# Patient Record
Sex: Female | Born: 1985 | Race: White | Hispanic: No | Marital: Single | Smoking: Former smoker
Health system: Southern US, Community
[De-identification: ages and names within clinical notes are randomized; demographics above are authoritative.]

## PROBLEM LIST (undated history)

## (undated) DIAGNOSIS — D509 Iron deficiency anemia, unspecified: Secondary | ICD-10-CM

## (undated) DIAGNOSIS — K250 Acute gastric ulcer with hemorrhage: Secondary | ICD-10-CM

---
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
## 2006-02-13 ENCOUNTER — Ambulatory Visit: Payer: Self-pay | Admitting: Pediatrics

## 2006-03-26 ENCOUNTER — Other Ambulatory Visit: Admission: RE | Admit: 2006-03-26 | Discharge: 2006-03-26 | Payer: Self-pay | Admitting: *Deleted

## 2006-04-01 ENCOUNTER — Ambulatory Visit: Payer: Self-pay | Admitting: Pediatrics

## 2006-08-26 ENCOUNTER — Ambulatory Visit: Payer: Self-pay | Admitting: Pediatrics

## 2007-01-23 ENCOUNTER — Ambulatory Visit: Payer: Self-pay | Admitting: Pediatrics

## 2007-01-28 ENCOUNTER — Ambulatory Visit (HOSPITAL_COMMUNITY): Admission: RE | Admit: 2007-01-28 | Discharge: 2007-01-28 | Payer: Self-pay | Admitting: Pediatrics

## 2007-01-30 ENCOUNTER — Ambulatory Visit: Payer: Self-pay | Admitting: Pediatrics

## 2007-03-18 ENCOUNTER — Ambulatory Visit: Payer: Self-pay | Admitting: Pediatrics

## 2007-04-01 ENCOUNTER — Other Ambulatory Visit: Admission: RE | Admit: 2007-04-01 | Discharge: 2007-04-01 | Payer: Self-pay | Admitting: Family Medicine

## 2013-09-09 ENCOUNTER — Inpatient Hospital Stay (HOSPITAL_COMMUNITY)
Admission: EM | Admit: 2013-09-09 | Discharge: 2013-09-12 | DRG: 378 | Disposition: A | Discharge status: Home / Self Care | Payer: Managed Care, Other (non HMO) | Attending: Internal Medicine | Admitting: Internal Medicine

## 2013-09-09 ENCOUNTER — Encounter (HOSPITAL_COMMUNITY): Payer: Self-pay | Admitting: Emergency Medicine

## 2013-09-09 DIAGNOSIS — T454X5A Adverse effect of iron and its compounds, initial encounter: Secondary | ICD-10-CM | POA: Diagnosis present

## 2013-09-09 DIAGNOSIS — D62 Acute posthemorrhagic anemia: Secondary | ICD-10-CM | POA: Diagnosis present

## 2013-09-09 DIAGNOSIS — D509 Iron deficiency anemia, unspecified: Secondary | ICD-10-CM

## 2013-09-09 DIAGNOSIS — K922 Gastrointestinal hemorrhage, unspecified: Principal | ICD-10-CM | POA: Diagnosis present

## 2013-09-09 DIAGNOSIS — D649 Anemia, unspecified: Secondary | ICD-10-CM | POA: Diagnosis present

## 2013-09-09 DIAGNOSIS — R531 Weakness: Secondary | ICD-10-CM

## 2013-09-09 DIAGNOSIS — Y9210 Unspecified residential institution as the place of occurrence of the external cause: Secondary | ICD-10-CM | POA: Diagnosis present

## 2013-09-09 DIAGNOSIS — R5381 Other malaise: Secondary | ICD-10-CM | POA: Diagnosis present

## 2013-09-09 DIAGNOSIS — R296 Repeated falls: Secondary | ICD-10-CM | POA: Diagnosis present

## 2013-09-09 DIAGNOSIS — R51 Headache: Secondary | ICD-10-CM | POA: Diagnosis present

## 2013-09-09 DIAGNOSIS — K259 Gastric ulcer, unspecified as acute or chronic, without hemorrhage or perforation: Secondary | ICD-10-CM | POA: Diagnosis present

## 2013-09-09 DIAGNOSIS — K250 Acute gastric ulcer with hemorrhage: Secondary | ICD-10-CM

## 2013-09-09 DIAGNOSIS — Z87891 Personal history of nicotine dependence: Secondary | ICD-10-CM

## 2013-09-09 DIAGNOSIS — J029 Acute pharyngitis, unspecified: Secondary | ICD-10-CM | POA: Diagnosis present

## 2013-09-09 DIAGNOSIS — L270 Generalized skin eruption due to drugs and medicaments taken internally: Secondary | ICD-10-CM | POA: Diagnosis present

## 2013-09-09 HISTORY — DX: Acute gastric ulcer with hemorrhage: K25.0

## 2013-09-09 HISTORY — DX: Iron deficiency anemia, unspecified: D50.9

## 2013-09-09 MED ORDER — SODIUM CHLORIDE 0.9 % IV BOLUS (SEPSIS)
1000.0000 mL | Freq: Once | INTRAVENOUS | Status: AC
  Administered 2013-09-10: 1000 mL via INTRAVENOUS

## 2013-09-09 MED ORDER — ONDANSETRON HCL 4 MG/2ML IJ SOLN
4.0000 mg | Freq: Once | INTRAMUSCULAR | Status: AC
  Administered 2013-09-10: 4 mg via INTRAVENOUS
  Filled 2013-09-09: qty 2

## 2013-09-09 NOTE — ED Notes (Addendum)
Pt states she has been feeling bad since Saturday with some weakness and some minor congestion.  Pt was seen by pcp and states nasal swab came back negative for flu.  Pt returned to PCP this morning for a migrane headache and was given scripts.  Tonight pt had one episode of vomiting, which was describe by EMS as coffee ground.  4mg  Zofran given IV by EMS

## 2013-09-10 ENCOUNTER — Encounter (HOSPITAL_COMMUNITY): Payer: Self-pay | Admitting: Internal Medicine

## 2013-09-10 ENCOUNTER — Encounter (HOSPITAL_COMMUNITY)
Admission: EM | Disposition: A | Discharge status: Home / Self Care | Payer: Self-pay | Source: Home / Self Care | Attending: Internal Medicine

## 2013-09-10 DIAGNOSIS — R5383 Other fatigue: Secondary | ICD-10-CM

## 2013-09-10 DIAGNOSIS — R5381 Other malaise: Secondary | ICD-10-CM | POA: Diagnosis present

## 2013-09-10 DIAGNOSIS — R519 Headache, unspecified: Secondary | ICD-10-CM | POA: Diagnosis present

## 2013-09-10 DIAGNOSIS — J029 Acute pharyngitis, unspecified: Secondary | ICD-10-CM | POA: Diagnosis present

## 2013-09-10 DIAGNOSIS — D62 Acute posthemorrhagic anemia: Secondary | ICD-10-CM | POA: Diagnosis present

## 2013-09-10 DIAGNOSIS — R531 Weakness: Secondary | ICD-10-CM | POA: Diagnosis present

## 2013-09-10 DIAGNOSIS — K922 Gastrointestinal hemorrhage, unspecified: Secondary | ICD-10-CM | POA: Diagnosis present

## 2013-09-10 DIAGNOSIS — K250 Acute gastric ulcer with hemorrhage: Secondary | ICD-10-CM

## 2013-09-10 DIAGNOSIS — R51 Headache: Secondary | ICD-10-CM

## 2013-09-10 DIAGNOSIS — D649 Anemia, unspecified: Secondary | ICD-10-CM | POA: Diagnosis present

## 2013-09-10 HISTORY — PX: ESOPHAGOGASTRODUODENOSCOPY: SHX5428

## 2013-09-10 HISTORY — DX: Acute gastric ulcer with hemorrhage: K25.0

## 2013-09-10 LAB — COMPREHENSIVE METABOLIC PANEL
ALT: 14 U/L (ref 0–35)
AST: 18 U/L (ref 0–37)
Albumin: 3.1 g/dL — ABNORMAL LOW (ref 3.5–5.2)
Alkaline Phosphatase: 39 U/L (ref 39–117)
BUN: 13 mg/dL (ref 6–23)
CO2: 24 mEq/L (ref 19–32)
Calcium: 7.8 mg/dL — ABNORMAL LOW (ref 8.4–10.5)
Chloride: 105 mEq/L (ref 96–112)
Creatinine, Ser: 0.54 mg/dL (ref 0.50–1.10)
GFR calc Af Amer: >90 mL/min (ref >90)
GFR calc non Af Amer: >90 mL/min (ref >90)
Glucose, Bld: 119 mg/dL — ABNORMAL HIGH (ref 70–99)
Potassium: 4.2 mEq/L (ref 3.7–5.3)
Sodium: 139 mEq/L (ref 137–147)
Total Bilirubin: <0.2 mg/dL — ABNORMAL LOW (ref 0.3–1.2)
Total Protein: 5.2 g/dL — ABNORMAL LOW (ref 6.0–8.3)

## 2013-09-10 LAB — CBC WITH DIFFERENTIAL/PLATELET
Basophils Absolute: 0 10*3/uL (ref 0.0–0.1)
Basophils Relative: 0 % (ref 0–1)
Eosinophils Absolute: 0.4 10*3/uL (ref 0.0–0.7)
Eosinophils Relative: 3 % (ref 0–5)
HCT: 12.7 % — ABNORMAL LOW (ref 36.0–46.0)
Hemoglobin: 4.4 g/dL — CL (ref 12.0–15.0)
Lymphocytes Relative: 29 % (ref 12–46)
Lymphs Abs: 3.1 10*3/uL (ref 0.7–4.0)
MCH: 34.4 pg — ABNORMAL HIGH (ref 26.0–34.0)
MCHC: 34.6 g/dL (ref 30.0–36.0)
MCV: 99.2 fL (ref 78.0–100.0)
Monocytes Absolute: 0.7 10*3/uL (ref 0.1–1.0)
Monocytes Relative: 7 % (ref 3–12)
Neutro Abs: 6.5 10*3/uL (ref 1.7–7.7)
Neutrophils Relative %: 61 % (ref 43–77)
Platelets: 258 10*3/uL (ref 150–400)
RBC: 1.28 MIL/uL — ABNORMAL LOW (ref 3.87–5.11)
RDW: 14 % (ref 11.5–15.5)
WBC: 10.8 10*3/uL — ABNORMAL HIGH (ref 4.0–10.5)

## 2013-09-10 LAB — CBC
HEMATOCRIT: 26.4 % — AB (ref 36.0–46.0)
Hemoglobin: 9.1 g/dL — ABNORMAL LOW (ref 12.0–15.0)
MCH: 31.3 pg (ref 26.0–34.0)
MCHC: 34.5 g/dL (ref 30.0–36.0)
MCV: 90.7 fL (ref 78.0–100.0)
PLATELETS: 217 10*3/uL (ref 150–400)
RBC: 2.91 MIL/uL — AB (ref 3.87–5.11)
RDW: 16.6 % — ABNORMAL HIGH (ref 11.5–15.5)
WBC: 9.3 10*3/uL (ref 4.0–10.5)

## 2013-09-10 LAB — OCCULT BLOOD, POC DEVICE: FECAL OCCULT BLD: POSITIVE — AB

## 2013-09-10 LAB — ABO/RH: ABO/RH(D): O POS

## 2013-09-10 LAB — URINALYSIS, ROUTINE W REFLEX MICROSCOPIC
Bilirubin Urine: NEGATIVE
GLUCOSE, UA: NEGATIVE mg/dL
HGB URINE DIPSTICK: NEGATIVE
Ketones, ur: NEGATIVE mg/dL
Leukocytes, UA: NEGATIVE
Nitrite: NEGATIVE
Protein, ur: NEGATIVE mg/dL
SPECIFIC GRAVITY, URINE: 1.012 (ref 1.005–1.030)
Urobilinogen, UA: 0.2 mg/dL (ref 0.0–1.0)
pH: 7 (ref 5.0–8.0)

## 2013-09-10 LAB — PREPARE RBC (CROSSMATCH)

## 2013-09-10 LAB — RETICULOCYTES
RBC.: 2.93 MIL/uL — ABNORMAL LOW (ref 3.87–5.11)
Retic Count, Absolute: 131.9 10*3/uL (ref 19.0–186.0)
Retic Ct Pct: 4.5 % — ABNORMAL HIGH (ref 0.4–3.1)

## 2013-09-10 LAB — POCT PREGNANCY, URINE: PREG TEST UR: NEGATIVE

## 2013-09-10 SURGERY — EGD (ESOPHAGOGASTRODUODENOSCOPY)
Anesthesia: Moderate Sedation

## 2013-09-10 MED ORDER — BUTAMBEN-TETRACAINE-BENZOCAINE 2-2-14 % EX AERO
INHALATION_SPRAY | CUTANEOUS | Status: DC | PRN
  Administered 2013-09-10: 2 via TOPICAL

## 2013-09-10 MED ORDER — SODIUM CHLORIDE 0.9 % IV BOLUS (SEPSIS)
1000.0000 mL | Freq: Once | INTRAVENOUS | Status: AC
  Administered 2013-09-10: 1000 mL via INTRAVENOUS

## 2013-09-10 MED ORDER — MIDAZOLAM HCL 5 MG/ML IJ SOLN
INTRAMUSCULAR | Status: AC
  Filled 2013-09-10: qty 2

## 2013-09-10 MED ORDER — FOLIC ACID 1 MG PO TABS
1.0000 mg | ORAL_TABLET | Freq: Every day | ORAL | Status: DC
  Administered 2013-09-11 – 2013-09-12 (×2): 1 mg via ORAL
  Filled 2013-09-10 (×3): qty 1

## 2013-09-10 MED ORDER — MORPHINE SULFATE 2 MG/ML IJ SOLN
2.0000 mg | Freq: Four times a day (QID) | INTRAMUSCULAR | Status: DC | PRN
  Administered 2013-09-10: 2 mg via INTRAVENOUS

## 2013-09-10 MED ORDER — SODIUM CHLORIDE 0.9 % IV SOLN
INTRAVENOUS | Status: AC
  Administered 2013-09-10 – 2013-09-12 (×4): via INTRAVENOUS

## 2013-09-10 MED ORDER — PANTOPRAZOLE SODIUM 40 MG IV SOLR: 40.0000 mg | Freq: Two times a day (BID) | INTRAVENOUS | Status: DC

## 2013-09-10 MED ORDER — MORPHINE SULFATE 2 MG/ML IJ SOLN
2.0000 mg | INTRAMUSCULAR | Status: DC | PRN
  Administered 2013-09-10 – 2013-09-11 (×4): 2 mg via INTRAVENOUS
  Filled 2013-09-10 (×4): qty 1

## 2013-09-10 MED ORDER — THIAMINE HCL 100 MG/ML IJ SOLN
100.0000 mg | Freq: Every day | INTRAMUSCULAR | Status: DC
  Administered 2013-09-10: 100 mg via INTRAVENOUS
  Filled 2013-09-10 (×3): qty 1

## 2013-09-10 MED ORDER — HYDROCODONE-ACETAMINOPHEN 5-325 MG PO TABS
1.0000 | ORAL_TABLET | ORAL | Status: DC | PRN
  Administered 2013-09-10: 1 via ORAL
  Administered 2013-09-11 (×2): 2 via ORAL
  Filled 2013-09-10: qty 1
  Filled 2013-09-10: qty 2
  Filled 2013-09-10 (×2): qty 1
  Filled 2013-09-10 (×2): qty 2

## 2013-09-10 MED ORDER — ACETAMINOPHEN 650 MG RE SUPP: 650.0000 mg | Freq: Four times a day (QID) | RECTAL | Status: DC | PRN

## 2013-09-10 MED ORDER — VITAMIN B-1 100 MG PO TABS
100.0000 mg | ORAL_TABLET | Freq: Every day | ORAL | Status: DC
  Administered 2013-09-11 – 2013-09-12 (×2): 100 mg via ORAL
  Filled 2013-09-10 (×3): qty 1

## 2013-09-10 MED ORDER — ONDANSETRON HCL 4 MG/2ML IJ SOLN
4.0000 mg | Freq: Four times a day (QID) | INTRAMUSCULAR | Status: DC | PRN
  Administered 2013-09-11: 4 mg via INTRAVENOUS
  Filled 2013-09-10: qty 2

## 2013-09-10 MED ORDER — SODIUM CHLORIDE 0.9 % IV SOLN
INTRAVENOUS | Status: DC
Start: 2013-09-10 — End: 2013-09-11
  Administered 2013-09-10: 12:00:00 via INTRAVENOUS

## 2013-09-10 MED ORDER — SODIUM CHLORIDE 0.9 % IV SOLN
8.0000 mg/h | INTRAVENOUS | Status: DC
  Filled 2013-09-10 (×2): qty 80

## 2013-09-10 MED ORDER — ONDANSETRON HCL 4 MG PO TABS: 4.0000 mg | ORAL_TABLET | Freq: Four times a day (QID) | ORAL | Status: DC | PRN

## 2013-09-10 MED ORDER — SODIUM CHLORIDE 0.9 % IV SOLN
80.0000 mg | Freq: Once | INTRAVENOUS | Status: DC
  Filled 2013-09-10: qty 80

## 2013-09-10 MED ORDER — MORPHINE SULFATE 2 MG/ML IJ SOLN
INTRAMUSCULAR | Status: AC
  Filled 2013-09-10: qty 1

## 2013-09-10 MED ORDER — FENTANYL CITRATE 0.05 MG/ML IJ SOLN
INTRAMUSCULAR | Status: DC | PRN
  Administered 2013-09-10 (×3): 25 ug via INTRAVENOUS

## 2013-09-10 MED ORDER — FENTANYL CITRATE 0.05 MG/ML IJ SOLN
INTRAMUSCULAR | Status: AC
  Filled 2013-09-10: qty 2

## 2013-09-10 MED ORDER — MIDAZOLAM HCL 10 MG/2ML IJ SOLN
INTRAMUSCULAR | Status: DC | PRN
  Administered 2013-09-10 (×3): 2 mg via INTRAVENOUS

## 2013-09-10 MED ORDER — SODIUM CHLORIDE 0.9 % IV SOLN: INTRAVENOUS | Status: DC

## 2013-09-10 MED ORDER — ACETAMINOPHEN 325 MG PO TABS
650.0000 mg | ORAL_TABLET | Freq: Four times a day (QID) | ORAL | Status: DC | PRN
  Administered 2013-09-10: 650 mg via ORAL
  Filled 2013-09-10: qty 2

## 2013-09-10 MED ORDER — AMOXICILLIN-POT CLAVULANATE 875-125 MG PO TABS
1.0000 | ORAL_TABLET | Freq: Two times a day (BID) | ORAL | Status: DC
  Administered 2013-09-10 – 2013-09-11 (×3): 1 via ORAL
  Filled 2013-09-10 (×5): qty 1

## 2013-09-10 NOTE — ED Provider Notes (Signed)
CSN: [id number]     Arrival date & time 09/09/13  2326 History   First MD Initiated Contact with Patient 09/09/13 2349     Chief Complaint  Patient presents with  . Hematemesis  . Influenza   (Consider location/radiation/quality/duration/timing/severity/associated sxs/prior Treatment) HPI History provided by patient.  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 For the last week has been taking 800 mg Motrin for headaches and sore throat. About a week ago was evaluated in the clinic for headaches prescribed medications in addition to Tamiflu. She denies having any fevers, chills, cough or difficulty breathing. Since yesterday has developed generalized weakness and per family looks pale. Tonight at home vomited blood 3 times described large amount of bright red blood. She denies any lack or tarry stools. No abdominal pain. No back pain. LMP 3 days ago reports there was normal and denies any heavy bleeding. No difficulty with speech or gait. No unilateral weakness or numbness.  History reviewed. No pertinent past medical history. History reviewed. No pertinent past surgical history. No family history on file. History  Substance Use Topics  . Smoking status: Former Games developer  . Smokeless tobacco: Not on file  . Alcohol Use: 2.4 oz/week    4 Glasses of wine per week   OB History   Grav Para Term Preterm Abortions TAB SAB Ect Mult Living   0              Review of Systems  Constitutional: Negative for fever and chills.  Eyes: Negative for visual disturbance.  Respiratory: Negative for shortness of breath.   Cardiovascular: Negative for chest pain.  Gastrointestinal: Positive for vomiting. Negative for abdominal pain and blood in stool.  Genitourinary: Negative for dysuria and flank pain.  Musculoskeletal: Negative for back pain.  Skin: Negative for rash.  Neurological: Positive for dizziness, weakness and headaches.  All other  systems reviewed and are negative.    Allergies  Review of patient's allergies indicates no known allergies.  Home Medications   Current Outpatient Rx  Name  Route  Sig  Dispense  Refill  . amoxicillin-clavulanate (AUGMENTIN) 875-125 MG per tablet   Oral   Take 1 tablet by mouth 2 (two) times daily.         Marland Kitchen gabapentin (NEURONTIN) 100 MG capsule   Oral   Take 100 mg by mouth 3 (three) times daily.         Marland Kitchen oseltamivir (TAMIFLU) 75 MG capsule   Oral   Take 75 mg by mouth 2 (two) times daily.         . traMADol (ULTRAM) 50 MG tablet   Oral   Take 50 mg by mouth every 6 (six) hours as needed for moderate pain.          BP 113/64  Temp(Src) 98.5 F (36.9 C) (Oral)  Resp 20  SpO2 100%  LMP 09/06/2013 Physical Exam  Constitutional: She is oriented to person, place, and time. She appears well-developed and well-nourished.  HENT:  Head: Normocephalic and atraumatic.  Eyes: EOM are normal. Pupils are equal, round, and reactive to light.  Neck: Neck supple.  Cardiovascular: Regular rhythm and intact distal pulses.   Tachycardic  Pulmonary/Chest: Effort normal and breath sounds normal. No respiratory distress.  Abdominal: Soft. Bowel sounds are normal. She exhibits no distension. There is no tenderness. There is no rebound and no guarding.  Genitourinary:  Rectal exam: No tenderness,  no mass, no stool in rectal vault  Musculoskeletal: Normal range of motion. She exhibits no edema.  Neurological: She is alert and oriented to person, place, and time. No cranial nerve deficit. Coordination normal.  Skin: Skin is warm and dry. There is pallor.    ED Course  Procedures (including critical care time) Labs Review Labs Reviewed  CBC WITH DIFFERENTIAL - Abnormal; Notable for the following:    WBC 10.8 (*)    RBC 1.28 (*)    Hemoglobin 4.4 (*)    HCT 12.7 (*)    MCH 34.4 (*)    All other components within normal limits  COMPREHENSIVE METABOLIC PANEL - Abnormal;  Notable for the following:    Glucose, Bld 119 (*)    Calcium 7.8 (*)    Total Protein 5.2 (*)    Albumin 3.1 (*)    Total Bilirubin <0.2 (*)    All other components within normal limits  OCCULT BLOOD, POC DEVICE - Abnormal; Notable for the following:    Fecal Occult Bld POSITIVE (*)    All other components within normal limits  TYPE AND SCREEN  PREPARE RBC (CROSSMATCH)   CRITICAL CARE Performed by: Sunnie NielsenPITZ,Issa Luster Total critical care time: 35 Critical care time was exclusive of separately billable procedures and treating other patients. Critical care was necessary to treat or prevent imminent or life-threatening deterioration. Critical care was time spent personally by me on the following activities: development of treatment plan with patient and/or surrogate as well as nursing, discussions with consultants, evaluation of patient's response to treatment, examination of patient, obtaining history from patient or surrogate, ordering and performing treatments and interventions, ordering and review of laboratory studies, ordering and review of radiographic studies, pulse oximetry and re-evaluation of patient's condition. IV fluids provided. IV Protonix drip. Type and cross with 2 units of packed blood cells provided. Discussed with GI on call Dr. Leone PayorGessner will plan endoscopy in the morning.  Consulted medicine and [facility] hospitalist to admit. Serial evaluations no significant changes in the emergency department. IV Zofran was provided and no hematemesis in the ED   MDM  Diagnosis: GI bleed, severe anemia, tachycardia  Evaluated with labs reviewed as above hemoglobin 4. blood transfusion. Protonix drip. GI consult and medical admission.     Sunnie NielsenBrian Brodyn Depuy, MD 09/10/13 [phone]

## 2013-09-10 NOTE — H&P (Signed)
Patient's PCP: No primary provider on file.  Chief Complaint: Vomiting blood  History of Present Illness: Margaret Brandt is a 28 y.o. Caucasian female no significant past medical history who presents with the above complaints.  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  About 3 weeks ago she had sore throat, she had a rapid strep done which per patient was negative.  She has been started on Advil for her symptoms which she has been taking twice daily.  About 5 days ago family noted that she was pale and weak.  She also has been having persistent headaches.  She went to Dr. Tenna Delaine office this morning and was given Augmentin for pharyngitis.  Last night she vomited up a lot of blood.  EMS was called and she was brought to the emergency department for further evaluation.  In the ER she was found to be anemic with a hemoglobin of 4.4.  The hospitalist service was asked to admit the patient for further care and management.  She admits feeling weak.  Complaints of sore throat but is controlled.  Denies any chest pain or shortness of breath.  Denies any abdominal pain or diarrhea.  Denies any black stools.  Denies any heavy bleeding with her menses which are monthly, last menstrual cycle started 3 days ago.  Review of Systems: All systems reviewed with the patient and positive as per history of present illness, otherwise all other systems are negative.  History reviewed. No pertinent past medical history. History reviewed. No pertinent past surgical history. Family History  Problem Relation Age of Onset  . Hypertension Mother   . Hypertension Father    History   Social History  . Marital Status: Single    Spouse Name: N/A    Number of Children: N/A  . Years of Education: N/A   Occupational History  . Not on file.   Social History Main Topics  . Smoking status: Former Games developer  . Smokeless tobacco: Not on file  . Alcohol Use: 2.4  oz/week    4 Glasses of wine per week  . Drug Use: No  . Sexual Activity: Not on file   Other Topics Concern  . Not on file   Social History Narrative  . No narrative on file   Allergies: Review of patient's allergies indicates no known allergies.  Home Meds: Prior to Admission medications   Medication Sig Start Date End Date Taking? Authorizing Provider  amoxicillin-clavulanate (AUGMENTIN) 875-125 MG per tablet Take 1 tablet by mouth 2 (two) times daily.   Yes Historical Provider, MD  gabapentin (NEURONTIN) 100 MG capsule Take 100 mg by mouth 3 (three) times daily.   Yes Historical Provider, MD  oseltamivir (TAMIFLU) 75 MG capsule Take 75 mg by mouth 2 (two) times daily.   Yes Historical Provider, MD  traMADol (ULTRAM) 50 MG tablet Take 50 mg by mouth every 6 (six) hours as needed for moderate pain.   Yes Historical Provider, MD    Physical Exam: Blood pressure 113/64, temperature 98.4 F (36.9 C), temperature source Oral, resp. rate 20, last menstrual period 09/06/2013, SpO2 100.00%. General: Awake, Oriented x3, No acute distress. HEENT: EOMI, Moist mucous membranes, pale in appearance, some exudates noted on tonsils bilaterally. Neck: Supple CV: S1 and S2 Lungs: Clear to ascultation bilaterally Abdomen: Soft, Nontender, Nondistended, +bowel sounds. Ext: Good pulses. Trace edema. No clubbing or cyanosis noted. Neuro: Cranial Nerves II-XII grossly intact.  Has 5/5 motor strength in upper and lower extremities.  Lab results:  Recent Labs  09/09/13 2336  NA 139  K 4.2  CL 105  CO2 24  GLUCOSE 119*  BUN 13  CREATININE 0.54  CALCIUM 7.8*    Recent Labs  09/09/13 2336  AST 18  ALT 14  ALKPHOS 39  BILITOT <0.2*  PROT 5.2*  ALBUMIN 3.1*   No results found for this basename: LIPASE, AMYLASE,  in the last 72 hours  Recent Labs  09/09/13 2336  WBC 10.8*  NEUTROABS 6.5  HGB 4.4*  HCT 12.7*  MCV 99.2  PLT 258   No results found for this basename: CKTOTAL,  CKMB, CKMBINDEX, TROPONINI,  in the last 72 hours No components found with this basename: POCBNP,  No results found for this basename: DDIMER,  in the last 72 hours No results found for this basename: HGBA1C,  in the last 72 hours No results found for this basename: CHOL, HDL, LDLCALC, TRIG, CHOLHDL, LDLDIRECT,  in the last 72 hours No results found for this basename: TSH, T4TOTAL, FREET3, T3FREE, THYROIDAB,  in the last 72 hours No results found for this basename: VITAMINB12, FOLATE, FERRITIN, TIBC, IRON, RETICCTPCT,  in the last 72 hours Imaging results:  No results found.  Assessment & Plan by Problem: Acute blood loss anemia Presumed to be due to upper GI bleed.  Transfuse the patient 4 units of PRBC.  Cycle CBC q. 8 hours.  Start IV pantoprazole 40 mg twice daily.  GI, Dr. Leone PayorGessner consulted.  Will have the patient n.p.o. for endoscopy in the morning.  Hold all NSAIDs.  Send anemia panel.  Hematemesis/upper GI bleed Management as indicated above.  Headache/malaise/weakness Likely due to severe anemia.  Suspect symptoms will improve with blood transfusion.  If the patient still has headache after blood confusion, consider further workup.  Pharyngitis Discontinue Tamiflu, no signs to suggest a viral upper respiratory infection at this time.  Continue Augmentin.  Alcohol use Start thiamine and folic acid.  Prophylaxis SCDs.  No heparin given concern for GI bleed.  CODE STATUS Full code.  Disposition Admit the patient to Med-Surg as inpatient.  Time spent on admission, talking to the patient, and coordinating care was: 50 mins.  Anesa Fronek A, MD 09/10/2013, 2:14 AM

## 2013-09-10 NOTE — Progress Notes (Signed)
  Per HPI:  Margaret ShelterCatherine A Brandt is a 28 y.o. Caucasian female no significant past medical history who presents with hematemesis.   About 5 days ago family noted that she was pale and weak. She also has been having persistent headaches. In the ER she was found to be anemic with a hemoglobin of 4.4. Has been taking Ibuprofen.  Denies any black stools. Denies any heavy bleeding with her menses which are monthly, last menstrual cycle started 3 days ago.  Patient seen and examined by me.  General:  A&O, appears stable.  Mother at bedside.  She is finishing her 3rd unit of blood. Very pale. CV:  Rrr, no m/r/g Resp:  CTA no w/c/r Abdomen:  Soft, thin, nt, nd, +bs Ext:  No swelling, clubbing, cyanosis.  Upper GI Bleed with ABLA Will be transfused 4 units On IV protonix BID [facility] is seeing her now and plans upper EGD today.  The rest of the management will per per Dr. Reddy's H&P completed early this am.   Algis DownsMarianne York, PA-C [facility] Pager: [phone]

## 2013-09-10 NOTE — Progress Notes (Signed)
Patient c/o severe headache,tylenol 650 mg given at 04:22,1 vicodin given at 05:44.Consulted with pharmacy regarding patient getting vicodin and tylenol and was told that it will be too much to give another vicodin because that would be 1300 mg of tylenol.MD Mikhail notified of patient's c/o,MD still in the process of shift change and will call back.Incoming RN made aware. Bricelyn Freestone Joselita,RN

## 2013-09-10 NOTE — Consult Note (Signed)
Consultation  Referring Provider:     Hospitalist Primary Care Physician:  No primary provider on file. Primary Gastroenterologist:    none     Reason for Consultation:     GI bleed     Impression / Plan:   Hematemesis, upper GI bleed and profound anemia. Cause not clear but recent NSAID use likely cause. She denies melena but says she was not observing her stools.   EGD with possible bleeding therapy will be performed. The risks and benefits as well as alternatives of endoscopic procedure(s) have been discussed and reviewed. All questions answered. The patient agrees to proceed.  Continue PPI, transfusions.          HPI:   Margaret Brandt is a 28 y.o. female, single, visiting family here in [place] and developed hematemesis yesterday. She had been ill in late November and tested negative for strep at [place]. Sore throat and myalgia, fatigue. Was Rxed with ibuprofen and also tramadol. Given Rx for Augmentin - was told to take if they called her with + strep Cx but never did so did not take. She felt progressively worse with fatigue, listlessness, body aches and significant headaches. Thought it was the flu. No fever. Things worsened and she had some bright red hematemesis last night, came to ED and was admitted. She feels better after transfusion x 3 U RBC. Acetaminophen did not help headache but morphine did.  No melena, hematochezia and no abdominal pain.  History reviewed. No pertinent past medical history. No hx anemia. Says has large tonsils and gets Strep throat frequently.  History reviewed. No pertinent past surgical history.  Family History  Problem Relation Age of Onset  . Hypertension Mother   . Hypertension Father      History  Substance Use Topics  . Smoking status: Former Games developermoker  .    Marland Kitchen. 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 Graduate of [employer]. Friend of Dr. Cassell Smilesanenbaum. Prior to Admission medications   Medication Sig Start Date End Date Taking? Authorizing Provider   Take 1 tablet by mouth 2 (two) times daily.   Yes Historical Provider, MD  gabapentin (NEURONTIN) 100 MG capsule Take 100 mg by mouth 3 (three) times daily.   Yes Historical Provider, MD  oseltamivir (TAMIFLU) 75 MG capsule Take 75 mg by mouth 2 (two) times daily.   Yes Historical Provider, MD  traMADol (ULTRAM) 50 MG tablet Take 50 mg by mouth every 6 (six) hours as needed for moderate pain.   Yes Historical Provider, MD    Current Facility-Administered Medications  Medication Dose Route Frequency Provider Last Rate Last Dose  . 0.9 %  sodium chloride infusion   Intravenous Continuous Cristal FordSrikar A Reddy, MD 75 mL/hr at 09/10/13 0300 500 mL at 09/10/13 0300  . acetaminophen (TYLENOL) tablet 650 mg  650 mg Oral Q6H PRN Cristal FordSrikar A Reddy, MD   650 mg at 09/10/13 0422   Or  . acetaminophen (TYLENOL) suppository 650 mg  650 mg Rectal Q6H PRN Cristal FordSrikar A Reddy, MD      . amoxicillin-clavulanate (AUGMENTIN) 875-125 MG per tablet 1 tablet  1 tablet Oral BID Cristal FordSrikar A Reddy, MD   1 tablet at 09/10/13 0422  . folic acid (FOLVITE) tablet 1 mg  1 mg Oral Daily Srikar Cherlynn KaiserA Reddy,  MD      . HYDROcodone-acetaminophen (NORCO/VICODIN) 5-325 MG per tablet 1-2 tablet  1-2 tablet Oral Q4H PRN Houston Siren, MD   1 tablet at 09/10/13 0554  . morphine 2 MG/ML injection 2 mg  2 mg Intravenous Q3H PRN Tora Kindred York, PA-C      . morphine 2 MG/ML injection           . ondansetron (ZOFRAN) tablet 4 mg  4 mg Oral Q6H PRN Cristal Ford, MD       Or  . ondansetron (ZOFRAN) injection 4 mg  4 mg Intravenous Q6H PRN Cristal Ford, MD      . Melene Muller ON 09/13/2013] pantoprazole (PROTONIX) injection 40 mg  40 mg Intravenous Q12H Sunnie Nielsen, MD      . thiamine (VITAMIN B-1) tablet 100 mg  100 mg Oral Daily Cristal Ford, MD       Or  . thiamine (B-1) injection 100 mg  100 mg  Intravenous Daily Cristal Ford, MD        Allergies as of 09/09/2013  . (No Known Allergies)     Review of Systems:    This is positive for those things mentioned in the HPI. She does not have heavy menses. Does get loose stools prior to menstruating. All other review of systems are negative.       Physical Exam:  Vital signs in last 24 hours: Temp:  [97.7 F (36.5 C)-99.4 F (37.4 C)] 98.5 F (36.9 C) (01/01 0820) Pulse Rate:  [94-113] 96 (01/01 0820) Resp:  [13-20] 18 (01/01 0820) BP: (96-132)/(58-78) 106/72 mmHg (01/01 0820) SpO2:  [100 %] 100 % (01/01 0820) Weight:  [135 lb 9.3 oz (61.5 kg)] 135 lb 9.3 oz (61.5 kg) (01/01 0300)    General:  Well-developed, well-nourished and in no acute distress Eyes:  anicteric. ENT:   Mouth and posterior pharynx free of lesions.  Neck:   supple w/o thyromegaly or mass.  Lungs: Clear to auscultation bilaterally. Heart:  S1S2, no rubs, murmurs, gallops. Abdomen:  soft, non-tender, no hepatosplenomegaly, hernia, or mass and BS+.  Lymph:  no cervical or supraclavicular adenopathy. Extremities:   no edema Skin   no rash. Neuro:  A&O x 3.  Psych:  appropriate mood and  Affect.   Data Reviewed:   LAB RESULTS:  Recent Labs  09/09/13 2336  WBC 10.8*  HGB 4.4*  HCT 12.7*  PLT 258   BMET  Recent Labs  09/09/13 2336  NA 139  K 4.2  CL 105  CO2 24  GLUCOSE 119*  BUN 13  CREATININE 0.54  CALCIUM 7.8*   LFT  Recent Labs  09/09/13 2336  PROT 5.2*  ALBUMIN 3.1*  AST 18  ALT 14  ALKPHOS 39  BILITOT <0.2*   Thanks   LOS: 1 day   @Johonna Binette  Sena Slate, MD, [facility] @  09/10/2013, 9:05 AM

## 2013-09-10 NOTE — Op Note (Signed)
[name], [name], Margaret A.  MR#: [id number] BIRTHDATE: [birth year] , 27  yrs. old GENDER: Female ENDOSCOPIST: Iva Booparl E Garlan Drewes, MD, [facility] PROCEDURE DATE:  09/10/2013 PROCEDURE:  EGD w/ control of bleeding ASA CLASS:     Class II INDICATIONS:  Projectile Hematemesis.  Hgb 4 MEDICATIONS: Fentanyl 75 mcg IV and Versed 6 mg IV TOPICAL ANESTHETIC: Cetacaine Spray  DESCRIPTION OF PROCEDURE: After the risks benefits and alternatives of the procedure were thoroughly explained, informed consent was obtained.  The Pentax Gastroscope [id number] endoscope was introduced through the mouth and advanced to the second portion of the duodenum. Without limitations.  The instrument was slowly withdrawn as the mucosa was fully examined.    STOMACH: A small non-bleeding linear ulcer with surrounding edema and a red spot was found on the greater curvature of the gastric body. Mid-body.  Hemostasis was attempted by placing two [place] Resolution hemoclips on the ulcer - given hx and this was only finding suspicious of a Dieaulafoy's lesion.  The remainder of the upper endoscopy exam was otherwise normal. Retroflexed views revealed as above.     The scope was then withdrawn from the patient and the procedure completed.  COMPLICATIONS: There were no complications. ENDOSCOPIC IMPRESSION: 1.   Small non-bleeding ulcer was found on the greater curvature of the gastric body; Hemostasis was attempted by placing two hemoclips on the bleeding site - ? Dieaulafoy's lesion, size, hx and location suggest 2.   The remainder of the upper endoscopy exam was otherwise normal  RECOMMENDATIONS: 1.  Avoid NSAIDS for two weeks 2.  Serial Hgb, PPI, start diet If ok Sat then home I have checked H.  pylori serology   eSigned:  Iva Booparl E Shanikia Kernodle, MD, [facility] 09/10/2013 10:48 AM  CC:The Patient

## 2013-09-11 ENCOUNTER — Encounter (HOSPITAL_COMMUNITY): Payer: Self-pay | Admitting: Internal Medicine

## 2013-09-11 DIAGNOSIS — K250 Acute gastric ulcer with hemorrhage: Secondary | ICD-10-CM

## 2013-09-11 DIAGNOSIS — J029 Acute pharyngitis, unspecified: Secondary | ICD-10-CM

## 2013-09-11 DIAGNOSIS — D509 Iron deficiency anemia, unspecified: Secondary | ICD-10-CM

## 2013-09-11 DIAGNOSIS — D649 Anemia, unspecified: Secondary | ICD-10-CM

## 2013-09-11 HISTORY — DX: Iron deficiency anemia, unspecified: D50.9

## 2013-09-11 LAB — TYPE AND SCREEN
ABO/RH(D): O POS
Antibody Screen: NEGATIVE
UNIT DIVISION: 0
Unit division: 0
Unit division: 0
Unit division: 0

## 2013-09-11 LAB — FERRITIN: Ferritin: 3 ng/mL — ABNORMAL LOW (ref 10–291)

## 2013-09-11 LAB — CBC
HCT: 26.3 % — ABNORMAL LOW (ref 36.0–46.0)
Hemoglobin: 9.3 g/dL — ABNORMAL LOW (ref 12.0–15.0)
MCH: 31.3 pg (ref 26.0–34.0)
MCHC: 35.4 g/dL (ref 30.0–36.0)
MCV: 88.6 fL (ref 78.0–100.0)
Platelets: 235 10*3/uL (ref 150–400)
RBC: 2.97 MIL/uL — ABNORMAL LOW (ref 3.87–5.11)
RDW: 16.4 % — AB (ref 11.5–15.5)
WBC: 6.1 10*3/uL (ref 4.0–10.5)

## 2013-09-11 LAB — VITAMIN B12: VITAMIN B 12: 870 pg/mL (ref 211–911)

## 2013-09-11 LAB — IRON AND TIBC
IRON: 26 ug/dL — AB (ref 42–135)
SATURATION RATIOS: 9 % — AB (ref 20–55)
TIBC: 279 ug/dL (ref 250–470)
UIBC: 253 ug/dL (ref 125–400)

## 2013-09-11 LAB — FOLATE: FOLATE: 18.8 ng/mL

## 2013-09-11 LAB — H. PYLORI ANTIBODY, IGG: H Pylori IgG: 0.68 {ISR}

## 2013-09-11 MED ORDER — PANTOPRAZOLE SODIUM 40 MG PO TBEC: 40.0000 mg | DELAYED_RELEASE_TABLET | Freq: Every day | ORAL | Status: DC

## 2013-09-11 MED ORDER — FAMOTIDINE 20 MG PO TABS
20.0000 mg | ORAL_TABLET | Freq: Two times a day (BID) | ORAL | Status: DC
  Administered 2013-09-12 (×2): 20 mg via ORAL
  Filled 2013-09-11 (×3): qty 1

## 2013-09-11 MED ORDER — DIPHENHYDRAMINE HCL 50 MG/ML IJ SOLN
INTRAMUSCULAR | Status: AC
  Filled 2013-09-11: qty 1

## 2013-09-11 MED ORDER — SODIUM CHLORIDE 0.9 % IV SOLN
250.0000 mg | Freq: Once | INTRAVENOUS | Status: AC
  Administered 2013-09-11: 250 mg via INTRAVENOUS
  Filled 2013-09-11 (×2): qty 20

## 2013-09-11 MED ORDER — DIPHENHYDRAMINE HCL 50 MG/ML IJ SOLN
25.0000 mg | Freq: Three times a day (TID) | INTRAMUSCULAR | Status: DC
  Administered 2013-09-11: 25 mg via INTRAVENOUS

## 2013-09-11 MED ORDER — PANTOPRAZOLE SODIUM 40 MG PO TBEC
40.0000 mg | DELAYED_RELEASE_TABLET | Freq: Every day | ORAL | Status: DC
  Administered 2013-09-11 – 2013-09-12 (×2): 40 mg via ORAL
  Filled 2013-09-11 (×2): qty 1

## 2013-09-11 MED ORDER — SODIUM CHLORIDE 0.9 % IV BOLUS (SEPSIS)
500.0000 mL | Freq: Once | INTRAVENOUS | Status: AC
  Administered 2013-09-11: 500 mL via INTRAVENOUS

## 2013-09-11 MED ORDER — ALPRAZOLAM 0.5 MG PO TABS
0.5000 mg | ORAL_TABLET | Freq: Once | ORAL | Status: AC
  Administered 2013-09-11: 0.5 mg via ORAL
  Filled 2013-09-11: qty 1

## 2013-09-11 MED ORDER — DIPHENHYDRAMINE HCL 25 MG PO CAPS: 25.0000 mg | ORAL_CAPSULE | Freq: Three times a day (TID) | ORAL | Status: DC

## 2013-09-11 MED ORDER — SODIUM CHLORIDE 0.9 % IV SOLN
25.0000 mg | Freq: Once | INTRAVENOUS | Status: AC
  Administered 2013-09-11: 25 mg via INTRAVENOUS
  Filled 2013-09-11: qty 2

## 2013-09-11 MED ORDER — SODIUM CHLORIDE 0.9 % IV BOLUS (SEPSIS)
500.0000 mL | Freq: Once | INTRAVENOUS | Status: AC
  Administered 2013-09-12: 500 mL via INTRAVENOUS

## 2013-09-11 NOTE — Progress Notes (Signed)
          Daily Rounding Note  09/11/2013, 12:19 PM  LOS: 2 days   SUBJECTIVE:       No stools for >36 hours.  No nausea, on full liquids.  No dizziness when she walks. Wondering if she can go home today instead of tomorrow.  Also wondering why she is getting Augmentin.  Having a headache, which is unusual for her. Given vicodin for this.   OBJECTIVE:         Vital signs in last 24 hours:    Temp:  [97.9 F (36.6 C)-99.3 F (37.4 C)] 97.9 F (36.6 C) (01/02 0904) Pulse Rate:  [80-93] 87 (01/02 0904) Resp:  [15-20] 18 (01/02 0904) BP: (113-124)/(64-85) 122/79 mmHg (01/02 0904) SpO2:  [99 %-100 %] 100 % (01/02 0904) Weight:  [63.4 kg (139 lb 12.4 oz)] 63.4 kg (139 lb 12.4 oz) (01/01 2050) Last BM Date: 09/08/14 General: looks well, on the pale side   Heart: RRR.  No MRG Chest: cler bil  No SOB Abdomen: NT, ND, active BS.  Soft  Extremities: no CCE Neuro/Psych:  Pleasant, no deficits.  Intake/Output from previous day: 01/01 0701 - 01/02 0700 In: 2698.3 [P.O.:90; I.V.:1666.7; Blood:941.7] Out: 1200 [Urine:1200]  Intake/Output this shift: Total I/O In: 120 [P.O.:120] Out: -   Lab Results:  Recent Labs  09/09/13 2336 09/10/13 1608 09/11/13 0620  WBC 10.8* 9.3 6.1  HGB 4.4* 9.1* 9.3*  HCT 12.7* 26.4* 26.3*  PLT 258 217 235   BMET  Recent Labs  09/09/13 2336  NA 139  K 4.2  CL 105  CO2 24  GLUCOSE 119*  BUN 13  CREATININE 0.54  CALCIUM 7.8*   LFT  Recent Labs  09/09/13 2336  PROT 5.2*  ALBUMIN 3.1*  AST 18  ALT 14  ALKPHOS 39  BILITOT <0.2*   Serum H pylori Ab is 0.68, this is negative.   ASSESMENT:   *  Hematemesis EGD 09/10/13: small, non-bleeding gastric ulcer at greater curvature treated with hemoclips x 2. ? Is this a Dieulafoy's lesion.  Recent use of Ibuprofen. Serum H pylori Ab is negative.   *  ABL anemia.  S/p transfusion with 3 PRBCs. Hgb much improved and stable.    PLAN   *   Daly PPI at discharge. I converted her to once daily Protonix po.  Pt well aware of avoiding Ibuprofen.   *  If still here tomorrow, CBC in AM.   *  [facility]  *  Wonder if she can discharge later today? Will need to d/w Dr Leone PayorGessner *  i stopped the Augmentin, I can not see convincing reason for this med.     Jennye MoccasinSarah Gribbin  09/11/2013, 12:19 PM Pager: [phone]  [facility] Attending  I have also seen and assessed the patient and agree with the above note. See other note also  Iva Booparl E. Jamaurion Slemmer, MD, [facility] [phone]) 09/11/2013 9:51 PM

## 2013-09-11 NOTE — Progress Notes (Signed)
[facility]                                                                              Patient Demographics  Margaret DiceCatherine Brandt, is a 28 y.o. female, DOB - 10-23-1985, WUJ:[id number]  Admit date - 09/09/2013   Admitting Physician Cristal FordSrikar A Reddy, MD  Outpatient Primary MD for the patient is No primary provider on file.  LOS - 2   Chief Complaint  Patient presents with  . Hematemesis  . Influenza        Assessment & Plan    Principal Problem:   Acute blood loss anemia Active Problems:   GI bleed   Headache   Malaise   Generalized weakness   Pharyngitis   Anemia   Acute gastric ulcer with hemorrhage - ? Dieaulafoy's lesion  Acute blood loss anemia with hematemesis, with iron deficiency  -Patient was transfused 4 units packed red blood cells since admission, her hemoglobin is currently stable. -GI was consulted, Dr. Leone PayorGessner -EGD was conducted showing a small nonbleeding ulcer, hemostasis was attempted by placing 2 hemoclips on the bleeding site -Recommendations are to avoid NSAIDs for 2 weeks continue PPIs -H. pylori IgG 0.68 -Anemia panel: Iron 26, TIBC 279, ferritin 3, folate 18.8, B12 870 -Will start patient on iron supplementation -Will continue to advance diet as tolerated  Headache/malaise/weakness -Likely secondary to anemia continue pain control -Weakness and malaise improving  Pharyngitis -Improved -Likely secondary to upper respiratory infection viral illness -Tamiflu as well as Augmentin were discontinued  Alcohol use -Continue thiamine and folic acid -Counseled  Code Status: Full  Family Communication: Mother at bedside  Disposition Plan: Admitted, will likely discharge 09/12/2013, will continue to monitor CBC   Procedures  EGD  Consults   Gastroenterology  DVT Prophylaxis SCDs  Lab Results  Component Value Date   PLT 235 09/11/2013    Medications  Scheduled Meds: . folic acid  1 mg Oral Daily  . pantoprazole  40 mg Oral  Daily  . thiamine  100 mg Oral Daily   Or  . thiamine  100 mg Intravenous Daily   Continuous Infusions: . sodium chloride 20 mL/hr at 09/11/13 1314   PRN Meds:.acetaminophen, acetaminophen, HYDROcodone-acetaminophen, morphine injection, ondansetron (ZOFRAN) IV, ondansetron  Antibiotics    Anti-infectives   Start     Dose/Rate Route Frequency Ordered Stop   09/10/13 0330  amoxicillin-clavulanate (AUGMENTIN) 875-125 MG per tablet 1 tablet  Status:  Discontinued     1 tablet Oral 2 times daily 09/10/13 0249 09/11/13 1242       Time Spent in minutes   30 minutes   Hershel Corkery D.O. on 09/11/2013 at 4:07 PM  Between 7am to 7pm - Pager - [phone]  After 7pm go to [email or web address] - password [email or web address]  And look for the night coverage person covering for me after hours  [facility] Office  [phone]    Subjective:   Margaret Brandt seen and examined today. Patient states she's feeling better, she still complains of headache however improved. Patient Her complaints of any abdominal pain or vomiting nausea.  Objective:   Filed Vitals:   09/10/13 2050 09/11/13 0515 09/11/13 [id number] 09/11/13 1320  BP: 124/74 117/64 122/79 116/83  Pulse: 93 80 87 94  Temp: 97.9 F (36.6 C) 98 F (36.7 C) 97.9 F (36.6 C) 98 F (36.7 C)  TempSrc: Oral Oral    Resp: 20 16 18 18   Height:      Weight: 63.4 kg (139 lb 12.4 oz)     SpO2: 100% 99% 100% 100%    Wt Readings from Last 3 Encounters:  09/10/13 63.4 kg (139 lb 12.4 oz)  09/10/13 63.4 kg (139 lb 12.4 oz)     Intake/Output Summary (Last 24 hours) at 09/11/13 1607 Last data filed at 09/11/13 1321  Gross per 24 hour  Intake  747.5 ml  Output      0 ml  Net  747.5 ml    Exam  General: Well developed, well nourished, NAD, appears stated age  HEENT: NCAT, PERRLA, EOMI, Anicteic Sclera, mucous membranes moist. No pharyngeal erythema or exudates  Neck: Supple, no JVD, no masses  Cardiovascular: S1 S2  auscultated, no rubs, murmurs or gallops. Regular rate and rhythm.  Respiratory: Clear to auscultation bilaterally with equal chest rise  Abdomen: Soft, nontender, nondistended, + bowel sounds  Extremities: warm dry without cyanosis clubbing or edema  Neuro: AAOx3, cranial nerves grossly intact. Strength 5/5 in patient's upper and lower extremities bilaterally  Skin: Without rashes exudates or nodules  Psych: Normal affect and demeanor with intact judgement and insight    Data Review   Micro Results No results found for this or any previous visit (from the past 240 hour(s)).  Radiology Reports No results found.  CBC  Recent Labs Lab 09/09/13 2336 09/10/13 1608 09/11/13 0620  WBC 10.8* 9.3 6.1  HGB 4.4* 9.1* 9.3*  HCT 12.7* 26.4* 26.3*  PLT 258 217 235  MCV 99.2 90.7 88.6  MCH 34.4* 31.3 31.3  MCHC 34.6 34.5 35.4  RDW 14.0 16.6* 16.4*  LYMPHSABS 3.1  --   --   MONOABS 0.7  --   --   EOSABS 0.4  --   --   BASOSABS 0.0  --   --     Chemistries   Recent Labs Lab 09/09/13 2336  NA 139  K 4.2  CL 105  CO2 24  GLUCOSE 119*  BUN 13  CREATININE 0.54  CALCIUM 7.8*  AST 18  ALT 14  ALKPHOS 39  BILITOT <0.2*   ------------------------------------------------------------------------------------------------------------------ estimated creatinine clearance is 99.8 ml/min (by C-G formula based on Cr of 0.54). ------------------------------------------------------------------------------------------------------------------ No results found for this basename: HGBA1C,  in the last 72 hours ------------------------------------------------------------------------------------------------------------------ No results found for this basename: CHOL, HDL, LDLCALC, TRIG, CHOLHDL, LDLDIRECT,  in the last 72 hours ------------------------------------------------------------------------------------------------------------------ No results found for this basename: TSH, T4TOTAL,  FREET3, T3FREE, THYROIDAB,  in the last 72 hours ------------------------------------------------------------------------------------------------------------------  Recent Labs  09/10/13 1608  VITAMINB12 870  FOLATE 18.8  FERRITIN 3*  TIBC 279  IRON 26*  RETICCTPCT 4.5*    Coagulation profile No results found for this basename: INR, PROTIME,  in the last 168 hours  No results found for this basename: DDIMER,  in the last 72 hours  Cardiac Enzymes No results found for this basename: CK, CKMB, TROPONINI, MYOGLOBIN,  in the last 168 hours ------------------------------------------------------------------------------------------------------------------ No components found with this basename: POCBNP,

## 2013-09-11 NOTE — Progress Notes (Signed)
Utilization review completed.  

## 2013-09-11 NOTE — Progress Notes (Signed)
    She has not had further bleeding. Unfortunately, I was busy all day with procedures and she did not hear that plan was to keep her in the hospital until tomorrow AM. I have explained the rationale to her and she understands.  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 Her job is not  physically demanding so I have oked return to work Tues 1/6. [place] [place]  I have also given her an order for a CBC to get done 1 week after dc and have results faxed to me.  She will obtain primary care f/u (establish care). As long as she is ok should not need GI care.  Rec take PPI qd x 2 months and stop.  She will limit alcohol for a few weeks.  She should take daily Fe SO4 po at dc also. She needs to be warned it may make stools dark.  Iva Booparl E. Jayln Branscom, MD, [facility] [phone]) 09/11/2013 9:56 PM

## 2013-09-11 NOTE — Progress Notes (Signed)
Patient c/o itching,hives noted all over body.Patient had just finished receiving ferric gluconate.VS taken and in epic.T. Callahan notified,rapid response RN called to assess patient.Will continue to monitor. Vershawn Westrup Joselita,RN

## 2013-09-11 NOTE — Progress Notes (Signed)
While RN was in the room to give medication,patient asked to go to bathroom.RN disconnected  iv from patient and told patient that she probably need to use the bedside commode but patient was already on her feet,ambulating to bathroom.Upon reaching bathroom door,patient  fell and hit her left side of her head,near left eye.Another RN came and we walked patient back to her bed,small abrasion noted on patient's left eyebrow,no bleeding noted.Patient able to answer questions and move extremities.VS taken.Rapid response RN came to room.MD on call notified.Patient's mother was in the room at the time of incident.Will continue to monitor. Ezana Hubbert Joselita,RN

## 2013-09-12 ENCOUNTER — Inpatient Hospital Stay (HOSPITAL_COMMUNITY): Payer: Managed Care, Other (non HMO)

## 2013-09-12 DIAGNOSIS — D509 Iron deficiency anemia, unspecified: Secondary | ICD-10-CM

## 2013-09-12 LAB — CBC
HCT: 35 % — ABNORMAL LOW (ref 36.0–46.0)
HCT: 29.5 % — ABNORMAL LOW (ref 36.0–46.0)
Hemoglobin: 11.9 g/dL — ABNORMAL LOW (ref 12.0–15.0)
Hemoglobin: 9.9 g/dL — ABNORMAL LOW (ref 12.0–15.0)
MCH: 30.7 pg (ref 26.0–34.0)
MCH: 30.6 pg (ref 26.0–34.0)
MCHC: 34 g/dL (ref 30.0–36.0)
MCHC: 33.6 g/dL (ref 30.0–36.0)
MCV: 90.4 fL (ref 78.0–100.0)
MCV: 91 fL (ref 78.0–100.0)
PLATELETS: 319 10*3/uL (ref 150–400)
PLATELETS: 298 10*3/uL (ref 150–400)
RBC: 3.87 MIL/uL (ref 3.87–5.11)
RBC: 3.24 MIL/uL — ABNORMAL LOW (ref 3.87–5.11)
RDW: 15.7 % — AB (ref 11.5–15.5)
RDW: 15.6 % — AB (ref 11.5–15.5)
WBC: 19.9 10*3/uL — ABNORMAL HIGH (ref 4.0–10.5)
WBC: 8.9 10*3/uL (ref 4.0–10.5)

## 2013-09-12 MED ORDER — PANTOPRAZOLE SODIUM 40 MG PO TBEC: 40.0000 mg | DELAYED_RELEASE_TABLET | Freq: Every day | ORAL | Status: DC

## 2013-09-12 MED ORDER — PANTOPRAZOLE SODIUM 40 MG PO TBEC: 40.0000 mg | DELAYED_RELEASE_TABLET | Freq: Every day | ORAL | Status: AC

## 2013-09-12 MED ORDER — TRAMADOL HCL 50 MG PO TABS: 50.0000 mg | ORAL_TABLET | Freq: Four times a day (QID) | ORAL | Status: AC | PRN

## 2013-09-12 MED ORDER — FLUTICASONE PROPIONATE 50 MCG/ACT NA SUSP
1.0000 | Freq: Every day | NASAL | Status: DC
Start: 2013-09-12 — End: 2013-09-12
  Filled 2013-09-12: qty 16

## 2013-09-12 MED ORDER — TRAMADOL HCL 50 MG PO TABS
50.0000 mg | ORAL_TABLET | Freq: Four times a day (QID) | ORAL | Status: DC | PRN
Start: 2013-09-12 — End: 2013-09-12

## 2013-09-12 MED ORDER — FLUTICASONE PROPIONATE 50 MCG/ACT NA SUSP: 1.0000 | Freq: Every day | NASAL | Status: AC

## 2013-09-12 MED ORDER — DIPHENHYDRAMINE HCL 50 MG/ML IJ SOLN
12.5000 mg | Freq: Three times a day (TID) | INTRAMUSCULAR | Status: DC
  Administered 2013-09-12: 12.5 mg via INTRAVENOUS
  Filled 2013-09-12 (×2): qty 0.25
  Filled 2013-09-12: qty 1
  Filled 2013-09-12: qty 0.25

## 2013-09-12 MED ORDER — FLUTICASONE PROPIONATE 50 MCG/ACT NA SUSP: 1.0000 | Freq: Every day | NASAL | Status: DC

## 2013-09-12 NOTE — Progress Notes (Signed)
   Events of overnight noted. She is OK now.  Lab Results  Component Value Date   HGB 11.9* 09/12/2013   Wt Readings from Last 3 Encounters:  09/10/13 139 lb 12.4 oz (63.4 kg)  09/10/13 139 lb 12.4 oz (63.4 kg)   Temp Readings from Last 3 Encounters:  09/12/13 98.7 F (37.1 C) Oral  09/12/13 98.7 F (37.1 C) Oral   BP Readings from Last 3 Encounters:  09/12/13 95/59  09/12/13 95/59   Pulse Readings from Last 3 Encounters:  09/12/13 91  09/12/13 91    She should go home this AM.  She has my office # and can use me for phone/office f/u while in town. Please see my note from last night as well for further f/u instructions and after hospital care.  Iva Booparl E. Kenlea Woodell, MD, [facility] [phone] (pager) 09/12/2013 8:31 AM

## 2013-09-12 NOTE — Progress Notes (Signed)
[facility] progress note. History of present illness. This 28 year old female presented with severe anemia requiring transfusion of 4 units packed red blood cells. As patient remained significantly anemic even posttransfusion IV iron was administered following a test dose. Patient had no apparent reaction to either test dose or ordered dose but shortly after the iron infusion the patient developed hives and nausea. I ordered her IV Benadryl and IV Pepcid. Nursing also indicated that her systolic blood pressure was somewhat soft in the 80s. I ordered a 500 cc normal saline bolus. Nursing indicated there was no evidence of facial or oral edema associated with the allergic reaction. I was informed later that patient fell and struck the left side of her head while ambulating to the bathroom. She was assisted back to bed and without apparent injury. I ordered a stat CT scan of the head to assess for any potential bleed with request nursing update me with result. Again her blood pressure was a little soft in the 80s systolic and I ordered a second 500 cc normal saline bolus. I came up to the bedside to further assess the patient at found her awake at CT scan. I did have a productive discussion with the patient's family while waiting for the patient's arrival back from CT scan regarding concerns they have about the patient's care and I updated them on my assessment of the situation and we agreed on a reasonable plan of care for going forward. Patient did arrive back in room from CT scan. I found her somnolent but easily arousable with verbal stimuli. Vital signs. Temperature 98.1, pulse 114, respiration 18, blood pressure 82/59. O2 sats 100%. General appearance. Well-developed thin female who is somnolent but easily aroused. No acute complaints. Cardiac. Rate and rhythm regular. Lungs. Breath sounds clear and equal. Abdomen. Soft with positive bowel sounds. No pain with palpation. Neurologic. Cranial nerves  2-12 grossly intact. Moves all extremities independently with no focal abnormalities seen. Patient is somnolent but easily arousable. Impression/plan. Problem #1. Allergic reaction with drug rash. Bleed patient had a allergic reaction to the IV iron despite passing the test dose. I am treating with IV Benadryl and oral Pepcid and the rash appears almost gone at this point. No indication of any oral edema/compromise. Patient is somnolent and I expect the reason is the IV Benadryl. I have decreased the dose to 12.5 mg. Problem #2. Fall with head strike. Patient on no anticoagulation. No evidence of trauma. No neurologic changes other than somnolence which again is likely due to the Benadryl. We'll follow for the CT scan results. Problem #3. A borderline hypotension. I suspect this is multifactorial because of some volume deficit and the administration of Benadryl. Will hydrate with some IV fluids via bolus. Will also increase the IV fluid rate to 75 cc per hour. Nursing will follow blood pressures post bolus and update me of any further hypotension.

## 2013-09-12 NOTE — Discharge Summary (Signed)
Physician Discharge Summary  Margaret Brandt:[id number] DOB: 12/03/1985 DOA: 09/09/2013  PCP: No primary provider on file.  Admit date: 09/09/2013 Discharge date: 09/12/2013  Time spent: 35 minutes  Recommendations for Outpatient Follow-up:  Patient will be discharged home. She saw her primary care physician within one week of discharge. She should also followup with a gastroenterologist of her choice back in [place]. Patient to continue taking her medications as prescribed. Patient should have her lab work and a visit in one week of discharge to monitor her CBC. Patient should overloaded any aspirin-containing, NSAID-containing medications or possibly to 4 weeks.   Discharge Diagnoses:  Principal Problem:   Acute blood loss anemia Active Problems:   GI bleed   Headache   Malaise   Generalized weakness   Pharyngitis   Anemia   Acute gastric ulcer with hemorrhage - ? Dieaulafoy's lesion   Iron deficiency anemia, unspecified   Discharge Condition: Stable  Diet recommendation: Regular  Filed Weights   09/10/13 0300 09/10/13 2050  Weight: 61.5 kg (135 lb 9.3 oz) 63.4 kg (139 lb 12.4 oz)    History of present illness:  Margaret Brandt is a 28 y.o. Caucasian female no significant past medical history who presents with the above complaints. 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 About 3 weeks ago she had sore throat, she had a rapid strep done which per patient was negative. She has been started on Advil for her symptoms which she has been taking twice daily. About 5 days ago family noted that she was pale and weak. She also has been having persistent headaches. She went to Dr. Tenna Delaine office this morning and was given Augmentin for pharyngitis. Last night she vomited up a lot of blood. EMS was called and she was brought to the emergency department for further evaluation. In the ER she was found to be anemic with  a hemoglobin of 4.4. The hospitalist service was asked to admit the patient for further care and management. She admits feeling weak. Complaints of sore throat but is controlled. Denies any chest pain or shortness of breath. Denies any abdominal pain or diarrhea. Denies any black stools. Denies any heavy bleeding with her menses which are monthly, last menstrual cycle started 3 days ago.   Hospital Course:  This is a 76 are old Caucasian female with no past medical history. She currently resides in [place]. Patient was here for the holidays. She presented to the emergency department with complaints of hematemesis. Patient was found to have hemoglobin approximately 4.4. She was transfused 4 units of blood. Gastroenterology was consulted for acute blood loss with anemia. EGD was conducted showing a small nonbleeding ulcer hemostasis was attempted by placing 2 hemoclips on the bleeding site. Recommendations were to NSAIDs for approximately 2-4 weeks and continue PPIs. She also had anemia panel conducted showing an iron of 26 TIBC 279, ferritin of 3, folate 18.8, B12 870. Patient was started on IV iron however had a reaction to this. Patient should only take iron via mouth. Patient probably has an underlying anemia that was undiagnosed. She should continue following with her primary care physician. Patient will be discharged with a prescription for Protonix and should continue this. On coming to the emergency department patient also had the experience headache as well as malaise and weakness. She had been started on Tamiflu as well as Augmentin prior to her arrival for questionable  flu versus pharyngitis. Her pharyngitis as well as weakness and malaise did continue to improve. Patient was negative for flu. She likely had an upper respiratory infection. As for her headaches patient should continue taking her tramadol this is likely secondary to her anemia. Patient does also have alcohol on a use. She was counseled  on alcohol consumption. Per gastroenterology recommendations patient should limit her alcohol for a few weeks she should take daily iron by mouth. Patient was warned her stools may be dark and she may have some constipation as well. Patient should have CBC conducted in one week's and those results should be faxed to Dr. Leone Payor. Patient will need to seek primary care as well. This was discussed with patient and her mother who is at bedside. They do understand and agree.  Procedures: EGD 09/10/13: small, non-bleeding gastric ulcer at greater curvature treated with hemoclips x 2. ? Is this a Dieulafoy's lesion.  Recent use of Ibuprofen. Serum H pylori Ab is negative.    Consultations: Gastroenterology  Discharge Exam: Filed Vitals:   09/12/13 1219  BP: 95/62  Pulse: 104  Temp: 97.9 F (36.6 C)  Resp: 18   Exam  General: Well developed, well nourished, NAD, appears stated age  HEENT: NCAT, PERRLA, EOMI, Anicteic Sclera, mucous membranes moist. No pharyngeal erythema or exudates  Neck: Supple, no JVD, no masses  Cardiovascular: S1 S2 auscultated, no rubs, murmurs or gallops. Regular rate and rhythm.  Respiratory: Clear to auscultation bilaterally with equal chest rise  Abdomen: Soft, nontender, nondistended, + bowel sounds  Extremities: warm dry without cyanosis clubbing or edema  Neuro: AAOx3, cranial nerves grossly intact. Strength 5/5 in patient's upper and lower extremities bilaterally  Skin: Without rashes exudates or nodules  Psych: Normal affect and demeanor with intact judgement and insight  Discharge Instructions  Discharge Orders   Future Orders Complete By Expires   Discharge instructions  As directed    Comments:     Patient will be discharged home. She saw her primary care physician within one week of discharge. She should also followup with a gastroenterologist of her choice back in [place]. Patient to continue taking her medications as prescribed. Patient should have  her lab work and a visit in one week of discharge to monitor her CBC. Patient should overloaded any aspirin-containing, NSAID-containing medications or possibly to 4 weeks.   Increase activity slowly  As directed        Medication List    STOP taking these medications       amoxicillin-clavulanate 875-125 MG per tablet  Commonly known as:  AUGMENTIN     oseltamivir 75 MG capsule  Commonly known as:  TAMIFLU      TAKE these medications       fluticasone 50 MCG/ACT nasal spray  Commonly known as:  FLONASE  Place 1 spray into both nostrils daily.     gabapentin 100 MG capsule  Commonly known as:  NEURONTIN  Take 100 mg by mouth 3 (three) times daily.     pantoprazole 40 MG tablet  Commonly known as:  PROTONIX  Take 1 tablet (40 mg total) by mouth daily.     traMADol 50 MG tablet  Commonly known as:  ULTRAM  Take 1 tablet (50 mg total) by mouth every 6 (six) hours as needed for moderate pain.       Allergies  Allergen Reactions  . Iron Hives       Follow-up Information   Follow up  with Primary care physician. Schedule an appointment as soon as possible for a visit in 1 week.       The results of significant diagnostics from this hospitalization (including imaging, microbiology, ancillary and laboratory) are listed below for reference.    Significant Diagnostic Studies: Ct Head Wo Contrast  09/12/2013   CLINICAL DATA:  Larey SeatFell.  Hit head.  EXAM: CT HEAD WITHOUT CONTRAST  TECHNIQUE: Contiguous axial images were obtained from the base of the skull through the vertex without intravenous contrast.  COMPARISON:  None.  FINDINGS: The ventricles are normal in size and configuration. No extra-axial fluid collections are identified. The gray-white differentiation is normal. No CT findings for acute intracranial process such as hemorrhage or infarction. No mass lesions. The brainstem and cerebellum are grossly normal.  The bony structures are intact. The paranasal sinuses and mastoid  air cells are clear. The globes are intact.  IMPRESSION: No acute intracranial findings or skull fracture.   Electronically Signed   By: Loralie ChampagneMark  Gallerani M.D.   On: 09/12/2013 00:47    Microbiology: No results found for this or any previous visit (from the past 240 hour(s)).   Labs: Basic Metabolic Panel:  Recent Labs Lab 09/09/13 2336  NA 139  K 4.2  CL 105  CO2 24  GLUCOSE 119*  BUN 13  CREATININE 0.54  CALCIUM 7.8*   Liver Function Tests:  Recent Labs Lab 09/09/13 2336  AST 18  ALT 14  ALKPHOS 39  BILITOT <0.2*  PROT 5.2*  ALBUMIN 3.1*   No results found for this basename: LIPASE, AMYLASE,  in the last 168 hours No results found for this basename: AMMONIA,  in the last 168 hours CBC:  Recent Labs Lab 09/09/13 2336 09/10/13 1608 09/11/13 0620 09/12/13 0407 09/12/13 1100  WBC 10.8* 9.3 6.1 19.9* 8.9  NEUTROABS 6.5  --   --   --   --   HGB 4.4* 9.1* 9.3* 11.9* 9.9*  HCT 12.7* 26.4* 26.3* 35.0* 29.5*  MCV 99.2 90.7 88.6 90.4 91.0  PLT 258 217 235 319 298   Cardiac Enzymes: No results found for this basename: CKTOTAL, CKMB, CKMBINDEX, TROPONINI,  in the last 168 hours BNP: BNP (last 3 results) No results found for this basename: PROBNP,  in the last 8760 hours CBG: No results found for this basename: GLUCAP,  in the last 168 hours     Signed:  Edsel PetrinMIKHAIL, Wesleigh Markovic  [facility] 09/12/2013, 12:33 PM

## 2013-09-12 NOTE — Discharge Instructions (Signed)

## 2013-09-14 ENCOUNTER — Encounter (HOSPITAL_COMMUNITY): Payer: Self-pay | Admitting: Internal Medicine

## 2015-01-13 IMAGING — CT CT HEAD W/O CM
2 series · 16 of 30 positions shown, 20 images · non-contrast
Comparison: None.

CLINICAL DATA: Fell.  Hit head.

EXAM:
CT HEAD WITHOUT CONTRAST
TECHNIQUE: Contiguous axial images were obtained from the base of the skull
through the vertex without intravenous contrast.

[Series 2: head w/o · axial · non-contrast · 0.39mm/px · z∈[+166,+296]mm · 13 of 32 slices shown, 17 images]
[im 3/32  brain]
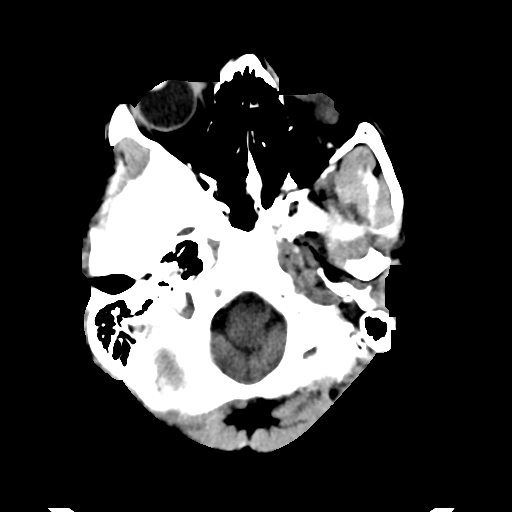
[im 3/32  bone]
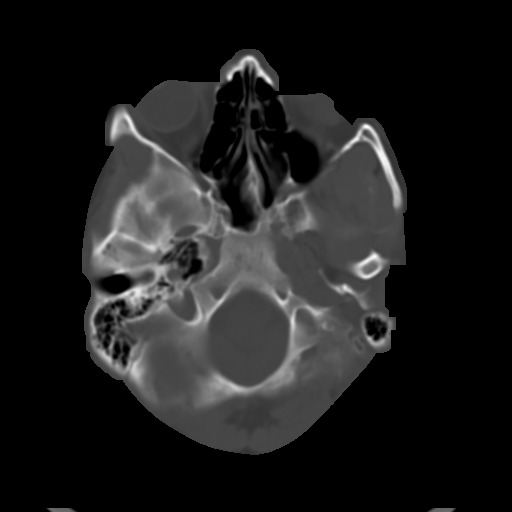
[im 5/32  brain]
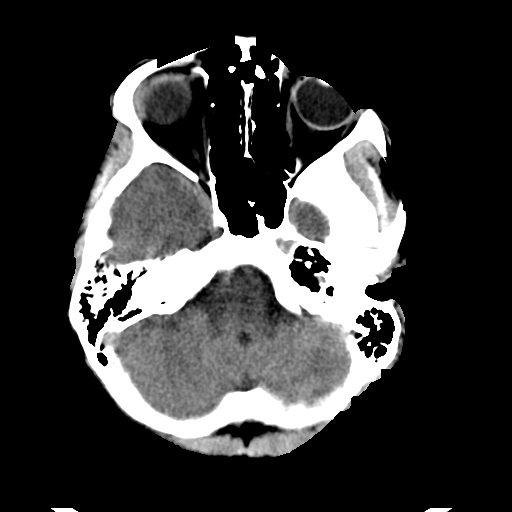
[im 7/32  brain]
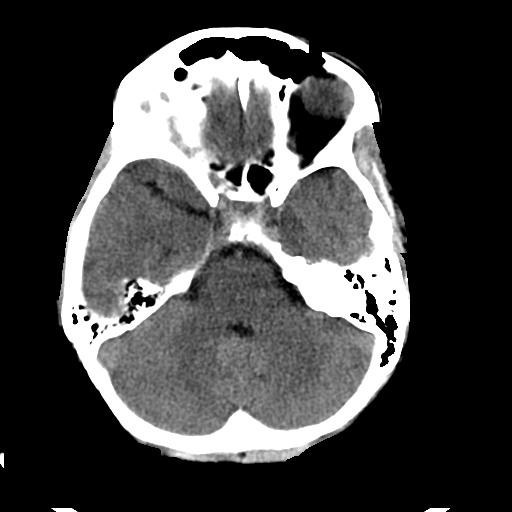
[im 9/32  brain]
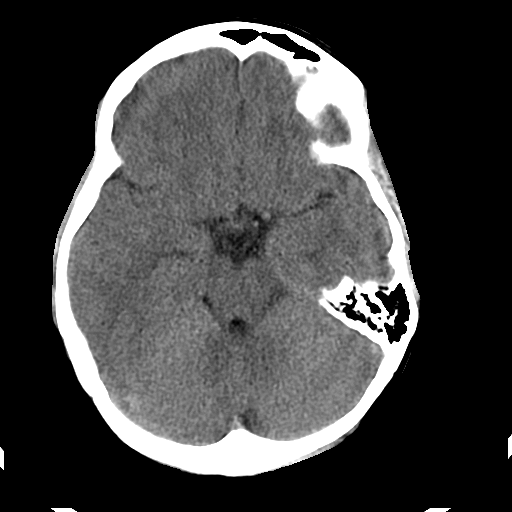
[im 12/32  brain]
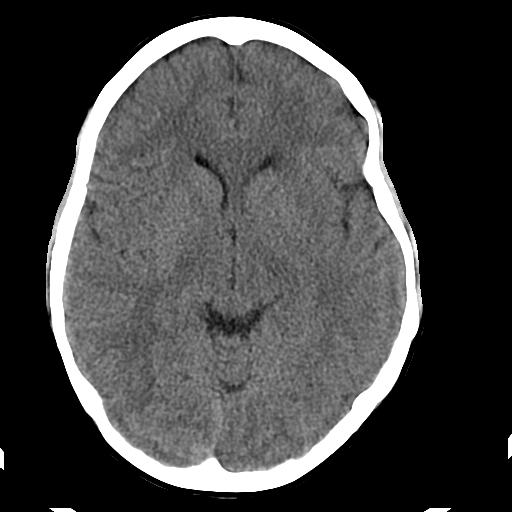
[im 12/32  bone]
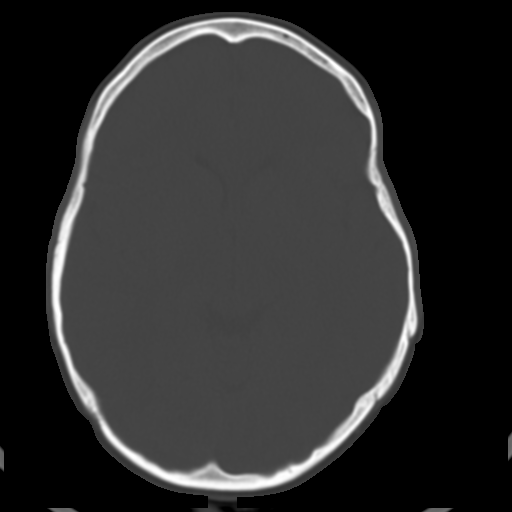
[im 14/32  brain]
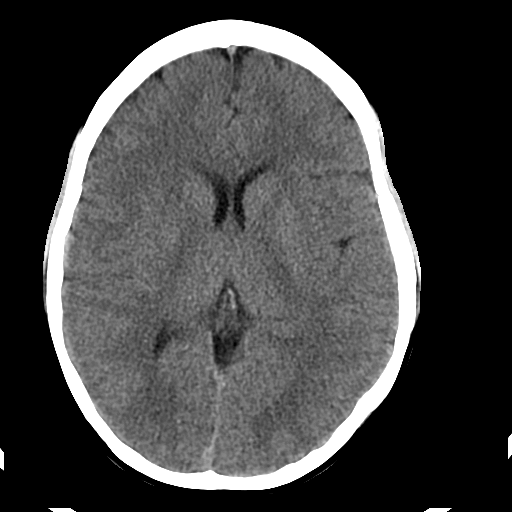
[im 16/32  brain]
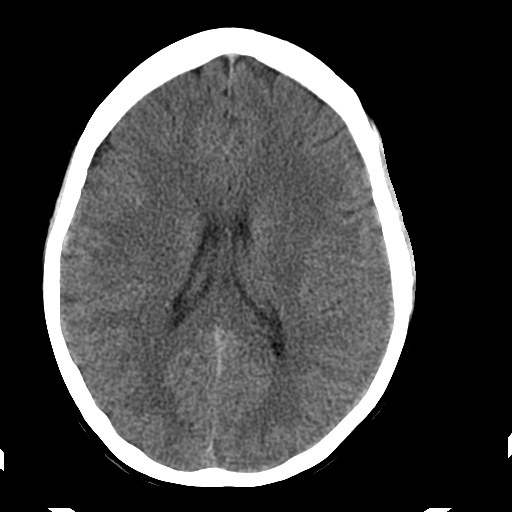
[im 18/32  brain]
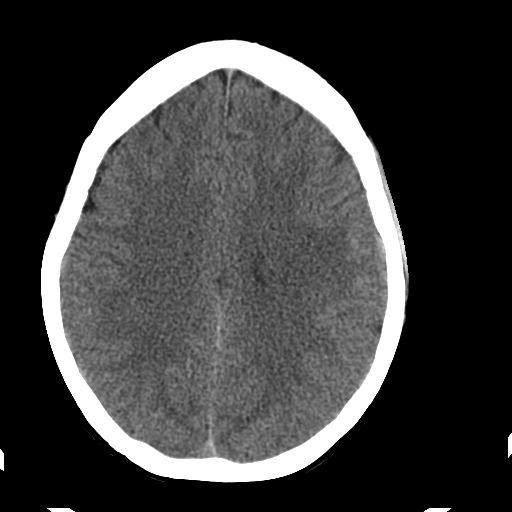
[im 20/32  brain]
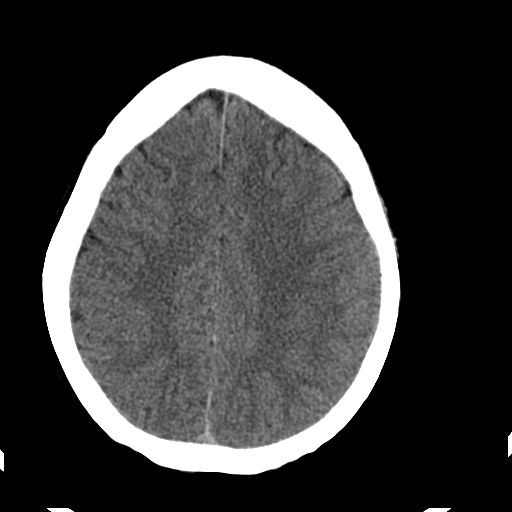
[im 20/32  bone]
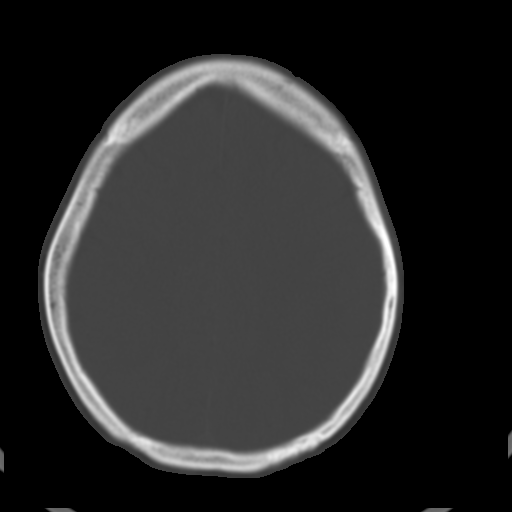
[im 23/32  brain]
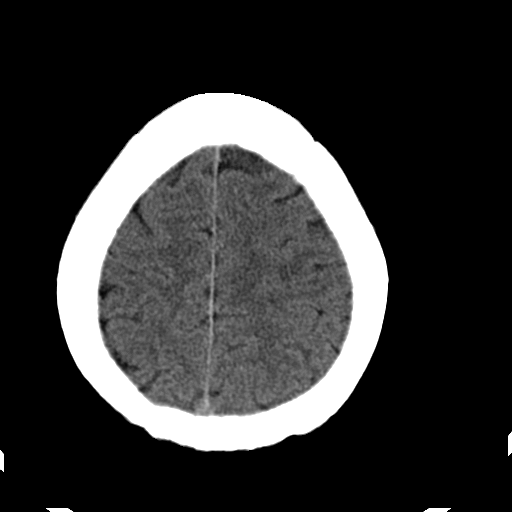
[im 25/32  brain]
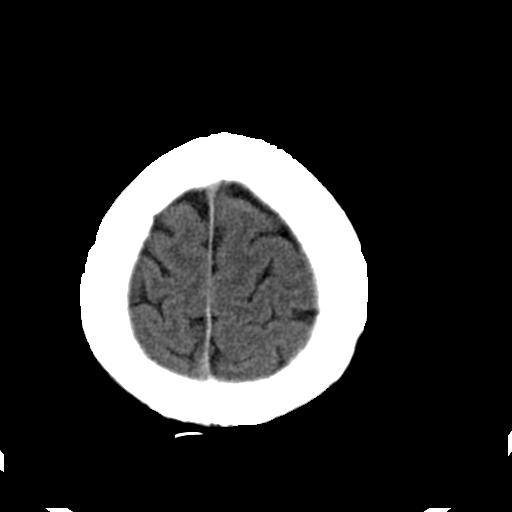
[im 27/32  brain]
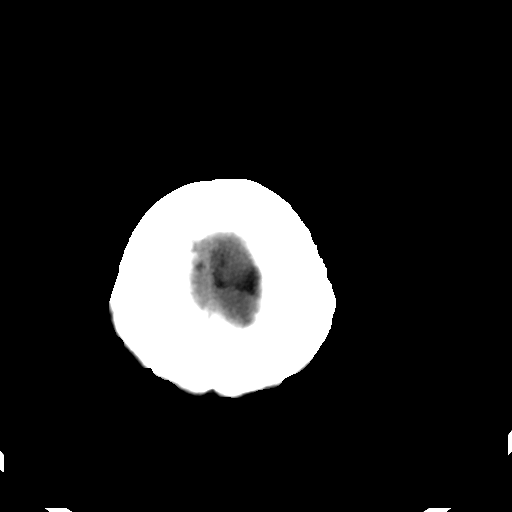
[im 29/32  brain]
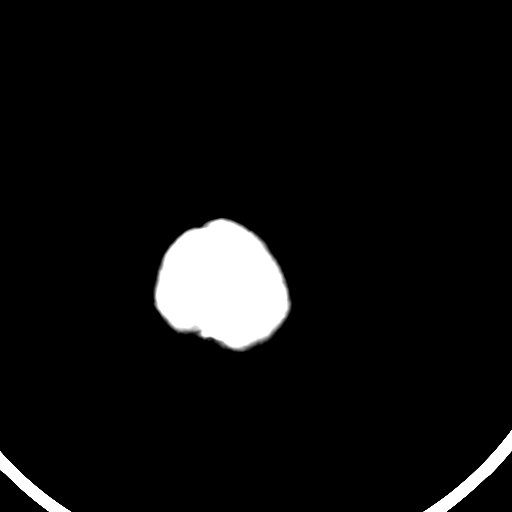
[im 29/32  bone]
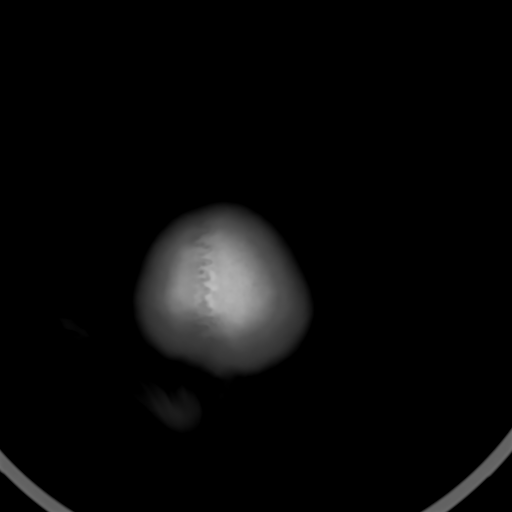

[Series 3: head w/o bone · axial · non-contrast · 0.39mm/px · z∈[+166,+210]mm · 3 of 32 slices shown]
[im 3/32  bone]
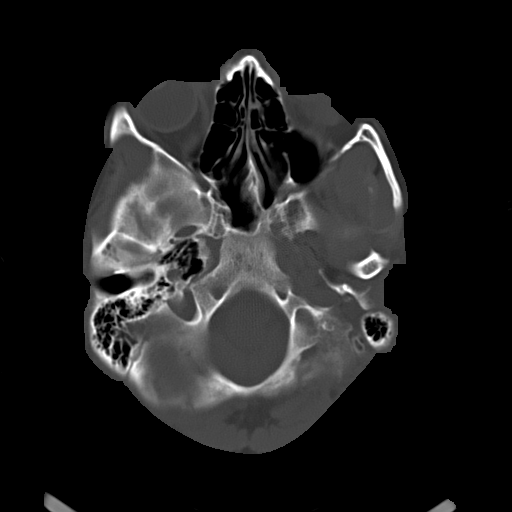
[im 7/32  bone]
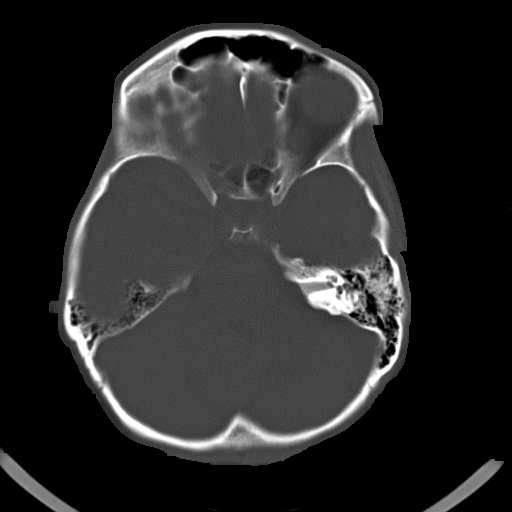
[im 12/32  bone]
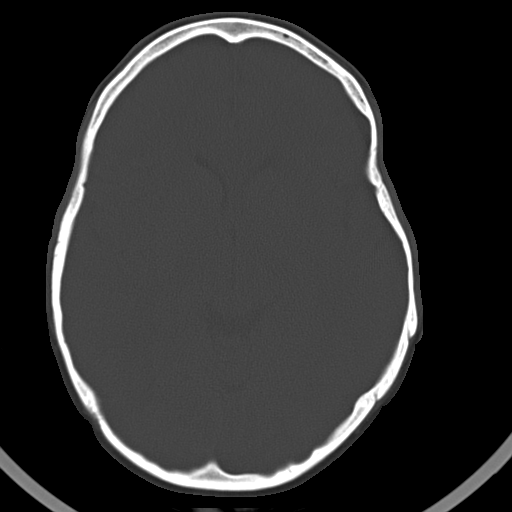

[16 of 30 positions shown; findings below may reference images not displayed]

FINDINGS: The ventricles are normal in size and configuration. No extra-axial
fluid collections are identified. The gray-white differentiation is
normal. No CT findings for acute intracranial process such as
hemorrhage or infarction. No mass lesions. The brainstem and
cerebellum are grossly normal.

The bony structures are intact. The paranasal sinuses and mastoid
air cells are clear. The globes are intact.
IMPRESSION: No acute intracranial findings or skull fracture.
